# Patient Record
Sex: Male | Born: 1950 | Race: Black or African American | Hispanic: No | Marital: Married | Smoking: Never smoker
Health system: Southern US, Community
[De-identification: ages and names within clinical notes are randomized; demographics above are authoritative.]

---

## 2005-01-09 ENCOUNTER — Ambulatory Visit: Payer: Self-pay | Admitting: Unknown Physician Specialty

## 2007-09-24 ENCOUNTER — Other Ambulatory Visit: Payer: Self-pay

## 2007-09-24 ENCOUNTER — Emergency Department: Payer: Self-pay | Admitting: Emergency Medicine

## 2008-04-05 ENCOUNTER — Ambulatory Visit: Payer: Self-pay | Admitting: Internal Medicine

## 2009-04-10 ENCOUNTER — Ambulatory Visit: Payer: Self-pay | Admitting: Internal Medicine

## 2009-04-21 IMAGING — US ABDOMEN ULTRASOUND
1 series · 17 of 25 positions shown · non-contrast
Comparison: none

REASON FOR EXAM: cirrhosis of liver
COMMENTS:

[Series 1: abdomen ultrasound · 17 of 64 slices shown]
[im 1/64]
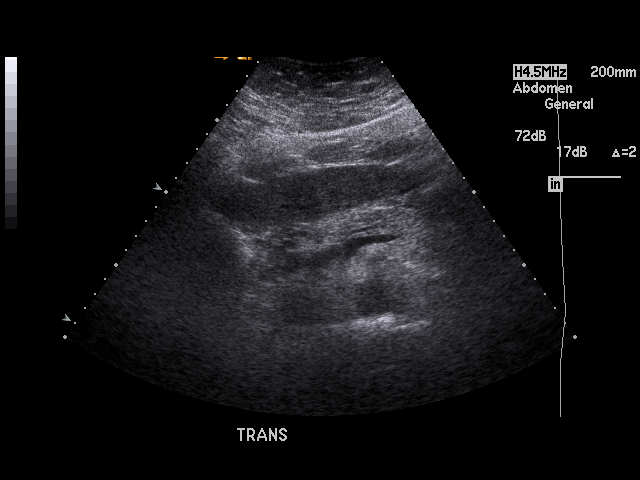
[im 6/64]
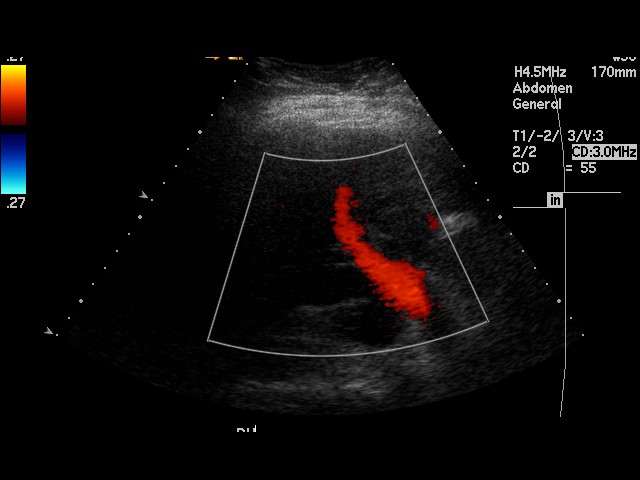
[im 8/64]
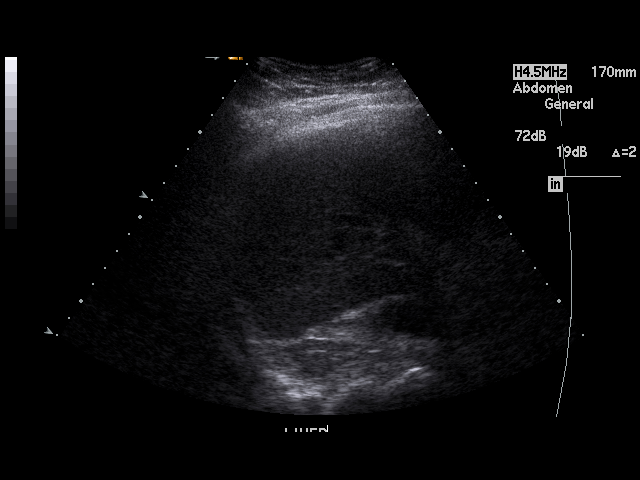
[im 14/64]
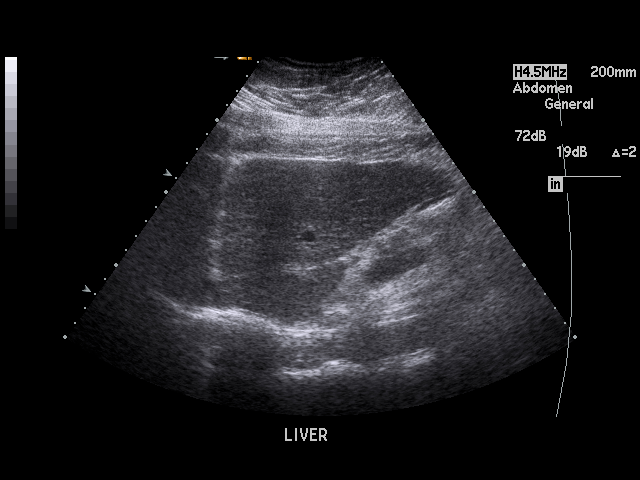
[im 16/64]
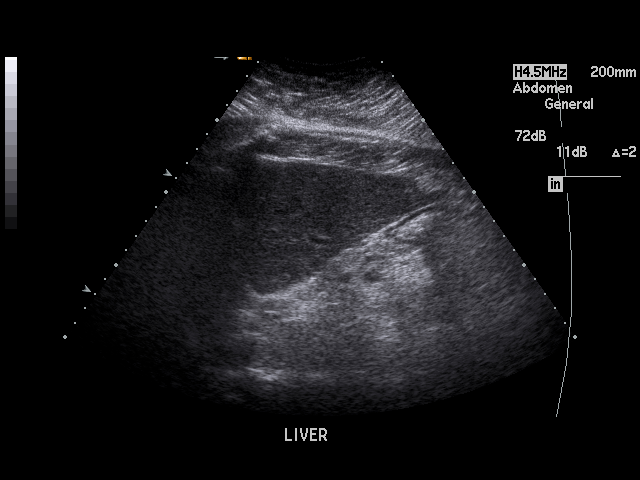
[im 22/64]
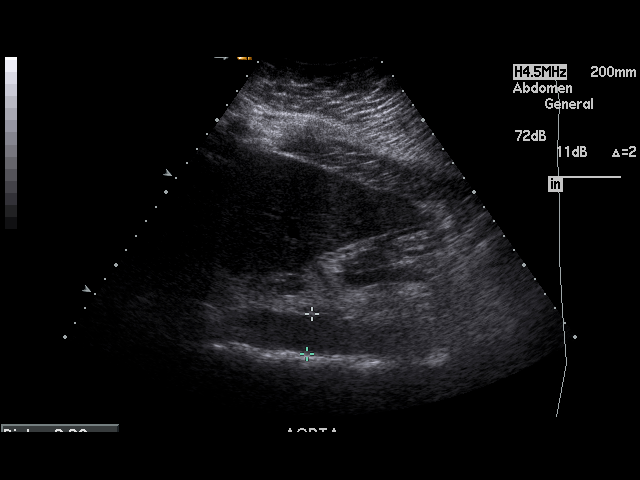
[im 24/64]
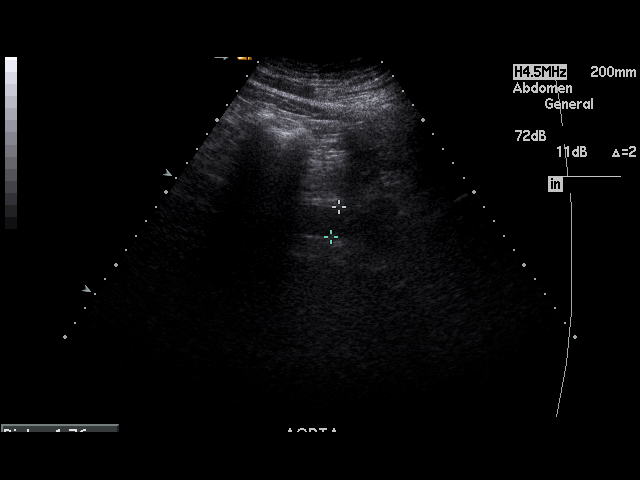
[im 29/64]
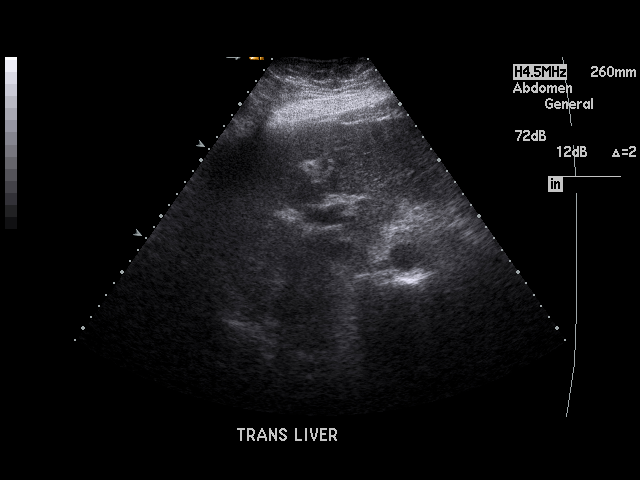
[im 32/64]
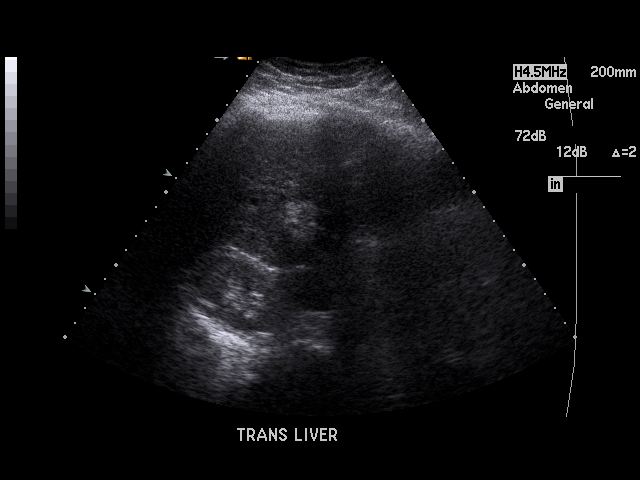
[im 35/64]
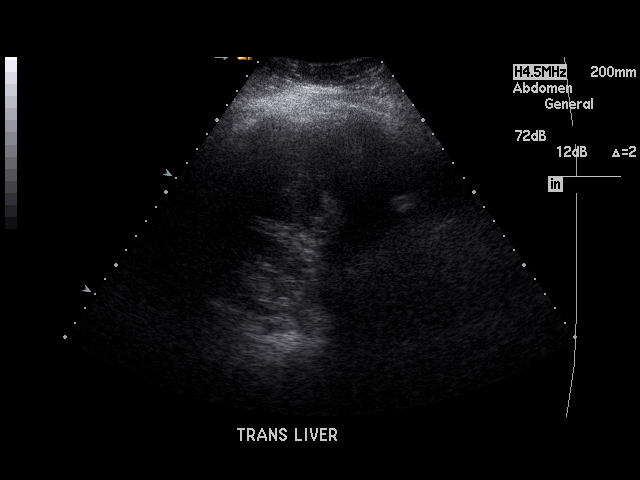
[im 40/64]
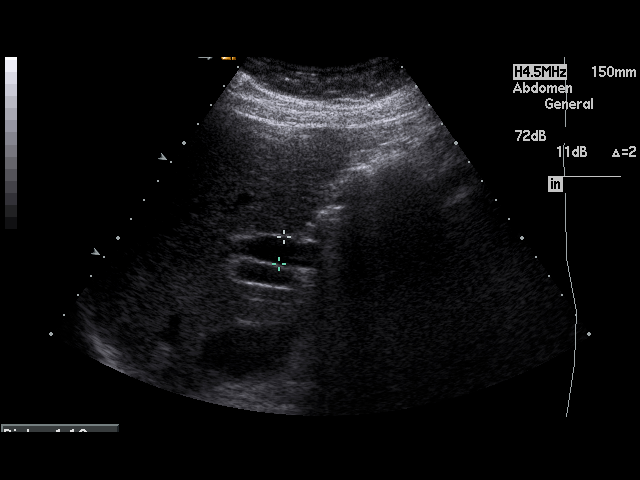
[im 43/64]
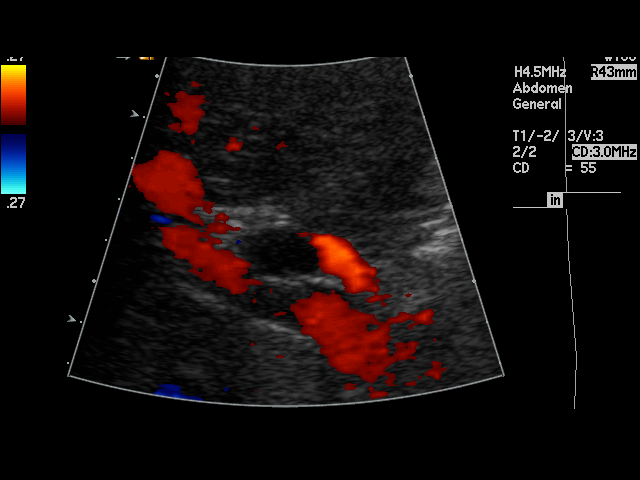
[im 48/64]
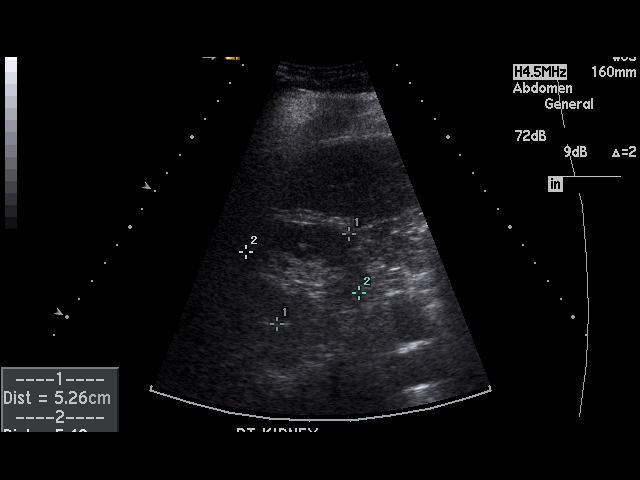
[im 50/64]
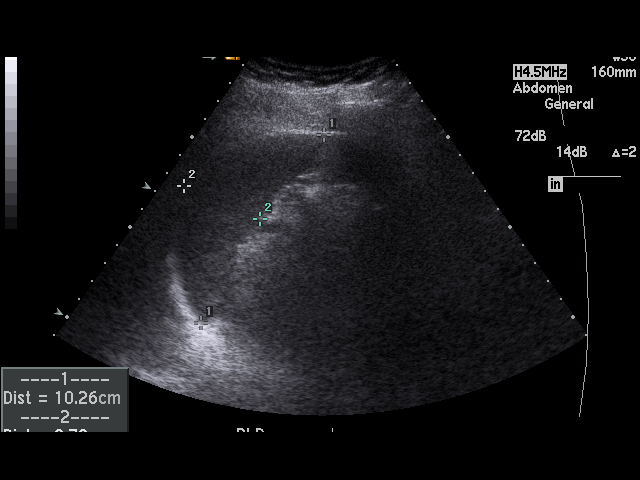
[im 56/64]
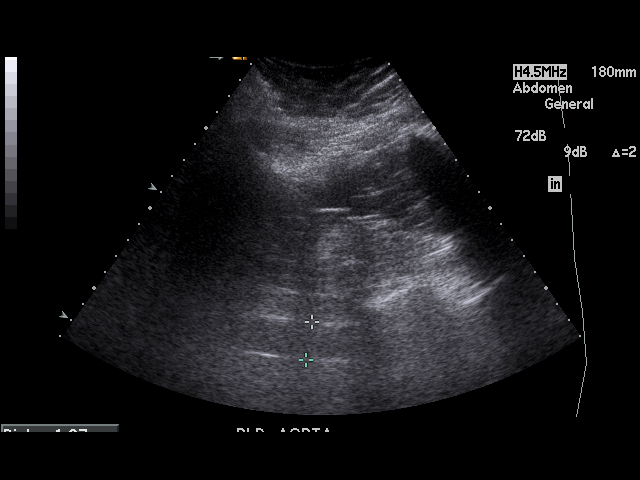
[im 58/64]
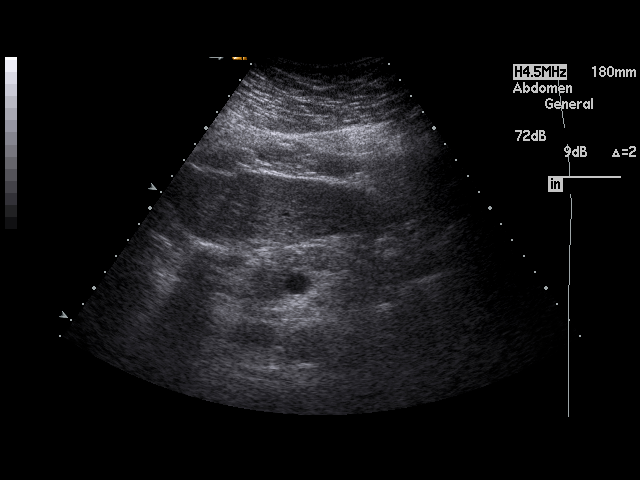
[im 64/64]
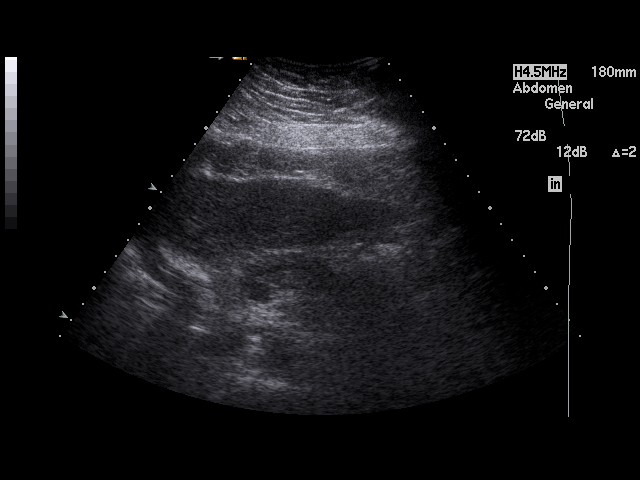

[17 of 25 positions shown; findings below may reference images not displayed]

PROCEDURE:     US  - US ABDOMEN GENERAL SURVEY  - April 05, 2008  [DATE]

RESULT:     The liver exhibits normal echotexture with no focal mass or
ductal dilation identified. The surface of the liver does not appear
abnormal in terms of being irregular or nodular in appearance. Portal venous
flow is normal in direction toward the liver. The pancreas is normal in
echotexture with no evidence of a mass or ductal dilation. The common bile
duct is mildly dilated at 1.2 cm. No intraluminal echoes are seen in the
common bile duct.

The spleen is normal in size at 10.6 cm. The abdominal aorta measures 2.2 cm
in diameter. The LEFT kidney measures 11.5 cm in length and the RIGHT kidney
12.6 cm in length. There is no evidence of ascites.
IMPRESSION: 1. The gallbladder is surgically absent.
2. The common bile duct is mildly dilated at 1.2 cm.
3. I do not see evidence of abnormality of the liver or spleen or pancreas.

## 2010-04-26 IMAGING — US ABDOMEN ULTRASOUND
1 series · 17 of 25 positions shown · non-contrast
Comparison: none

REASON FOR EXAM: cirrhosis
COMMENTS:

[Series 1: abdomen ultrasound · 17 of 62 slices shown]
[im 1/62]
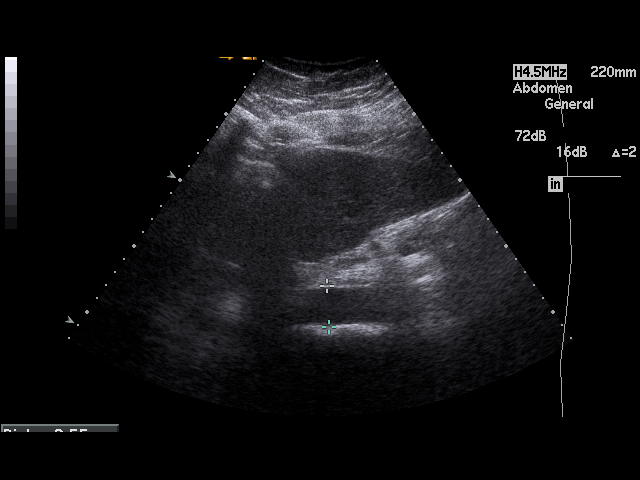
[im 6/62]
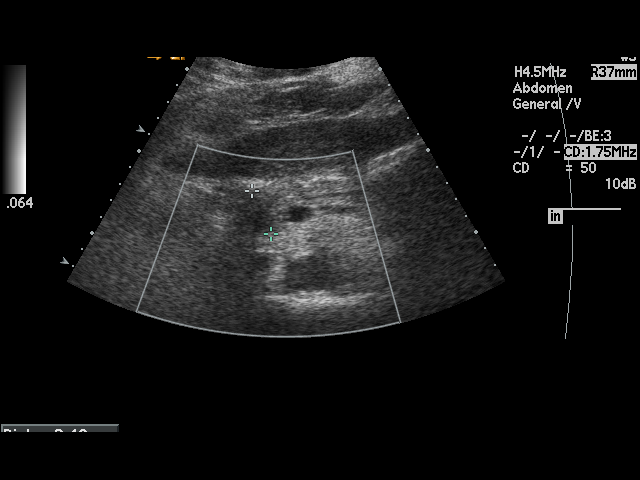
[im 8/62]
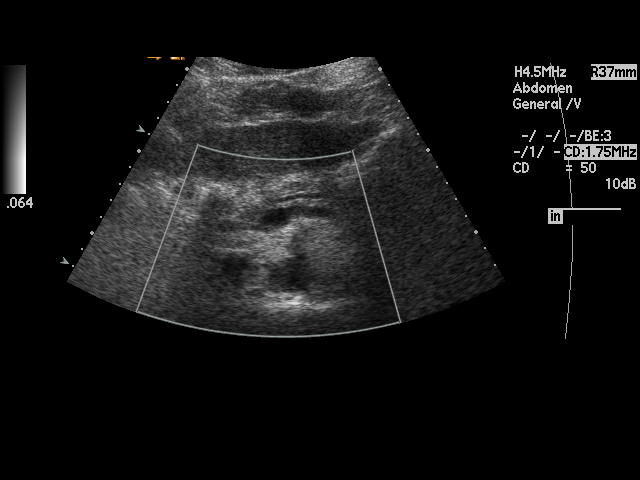
[im 13/62]
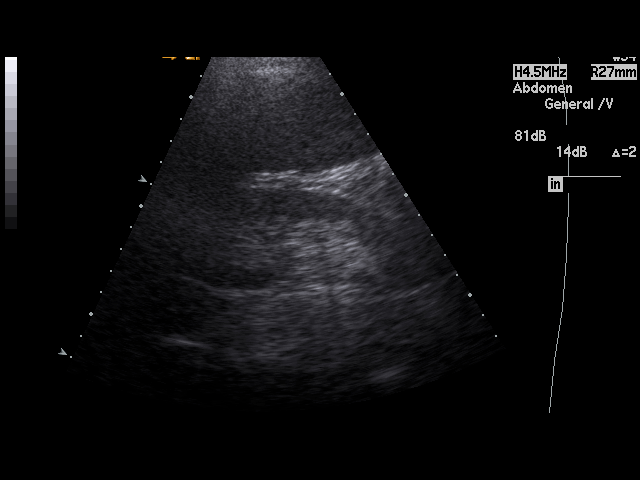
[im 16/62]
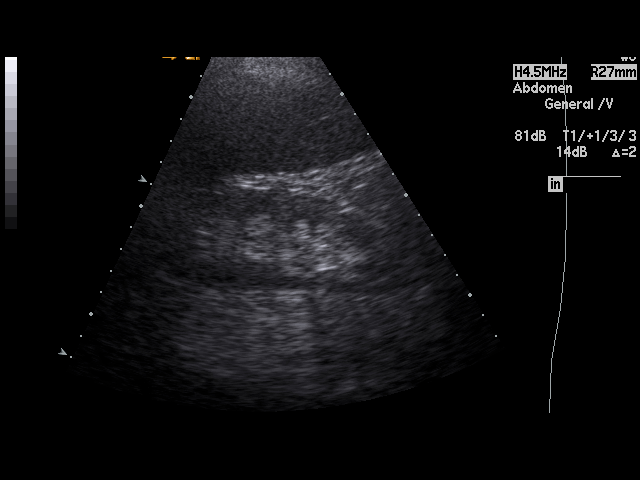
[im 21/62]
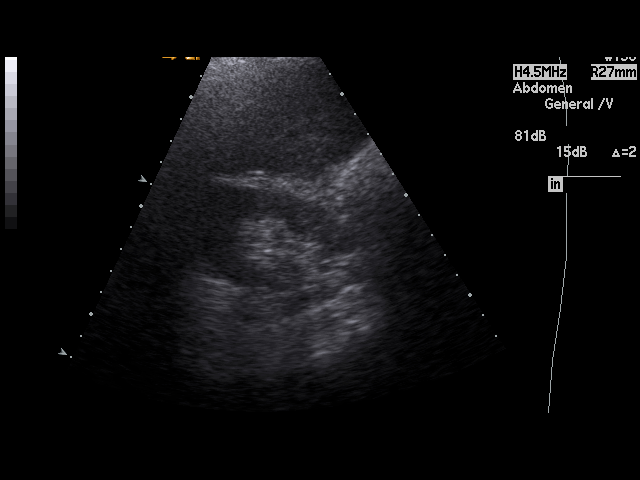
[im 23/62]
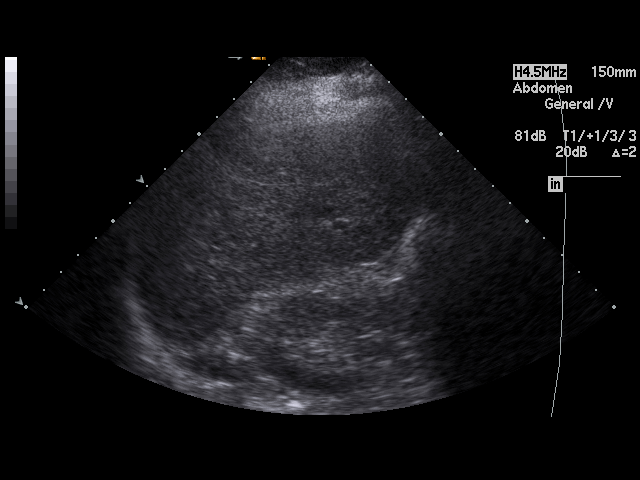
[im 28/62]
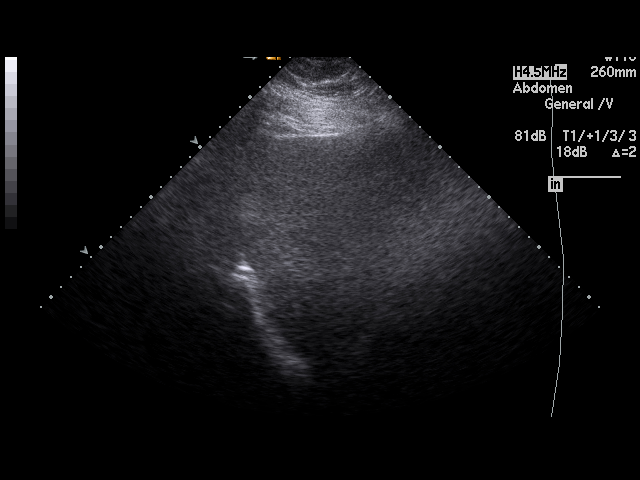
[im 31/62]
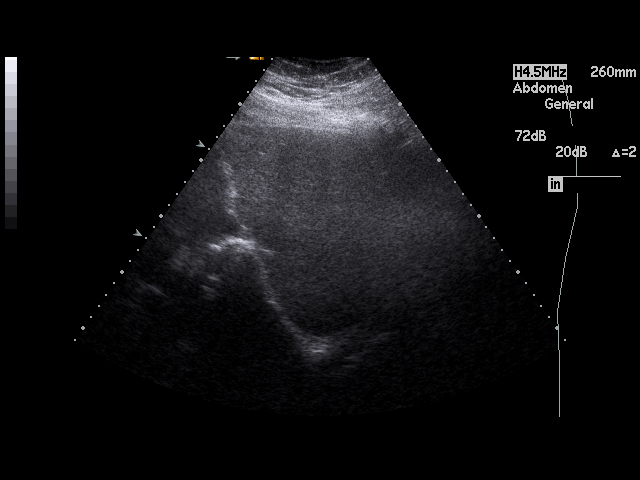
[im 34/62]
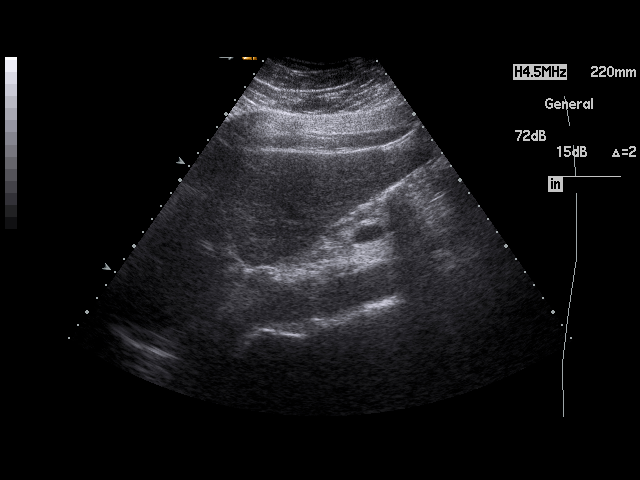
[im 39/62]
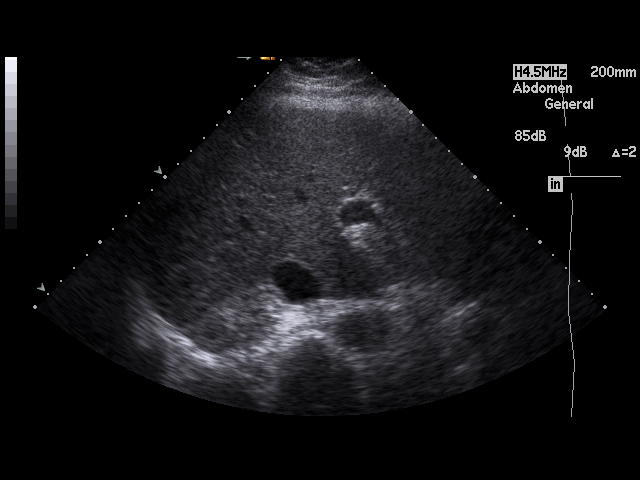
[im 41/62]
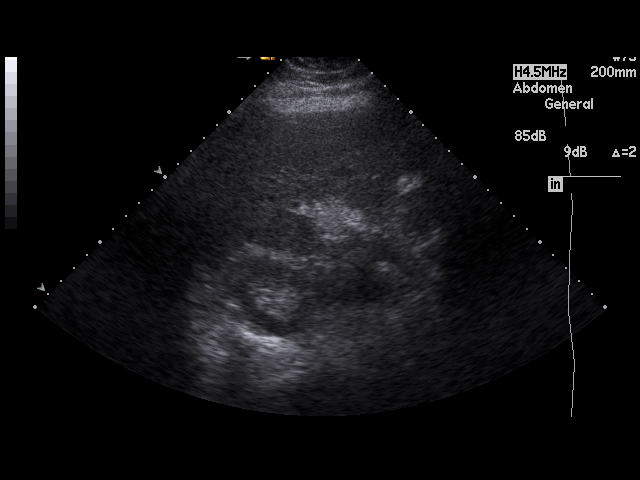
[im 46/62]
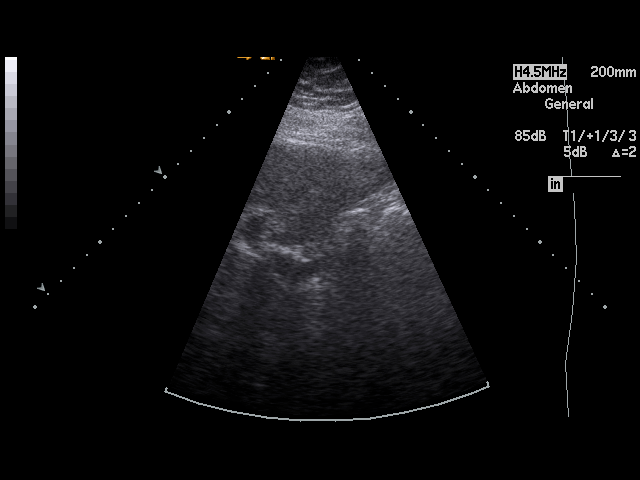
[im 49/62]
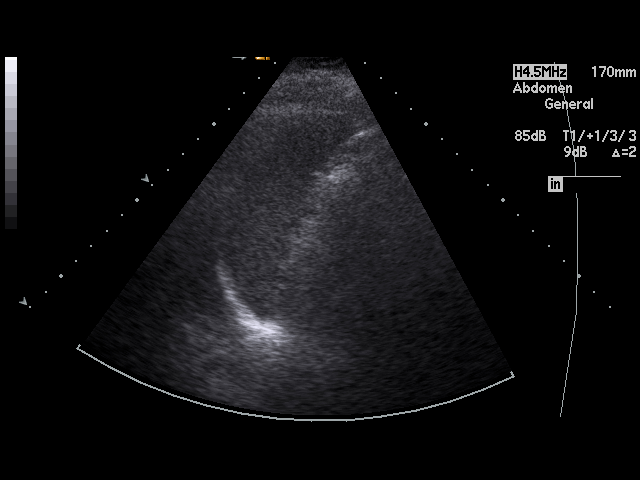
[im 54/62]
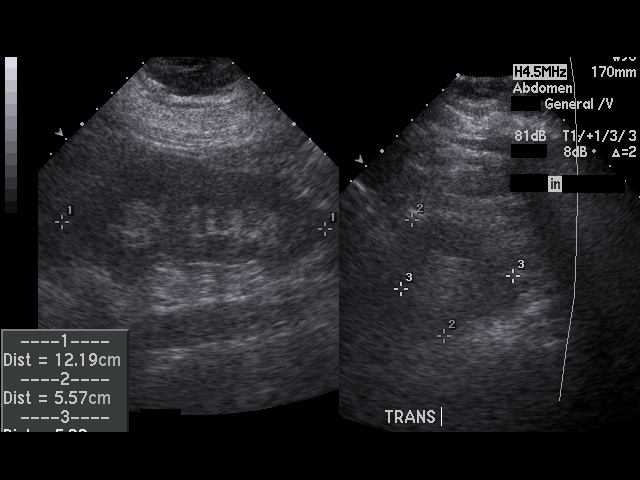
[im 56/62]
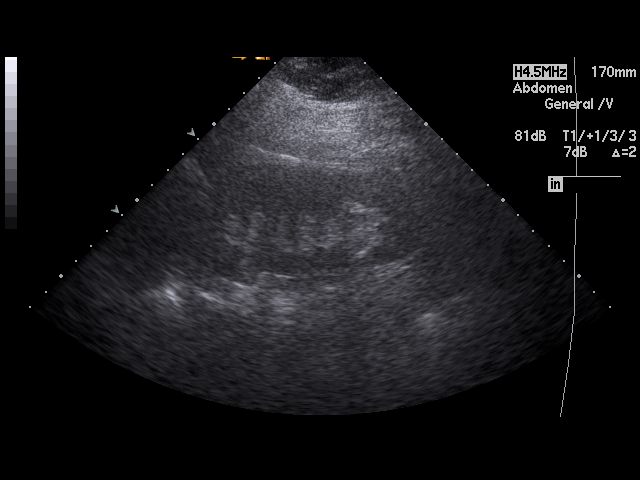
[im 62/62]
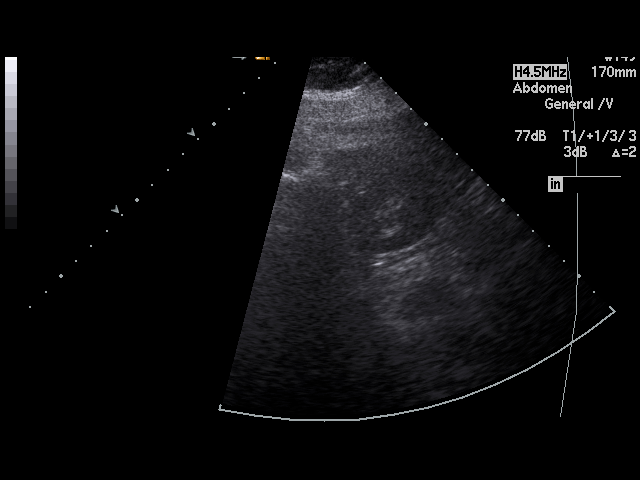

[17 of 25 positions shown; findings below may reference images not displayed]

PROCEDURE:     US  - US ABDOMEN GENERAL SURVEY  - April 10, 2009  [DATE]

RESULT:     The hepatic echo pattern appears normal. No hepatic mass lesions
are seen. The spleen is normal in size. The pancreas is normal in size.
There is nonspecific prominence of the pancreatic duct which measures 2.9 mm
in diameter. No pancreatic mass is seen. No pancreatic calcifications are
noted sonographically. The abdominal aorta and inferior vena cava show no
significant abnormalities. The patient is status post cholecystectomy. The
common bile duct measures 9.2 mm in diameter which is prominent but which
can be within normal limits for the post cholecystectomy patient and which
is smaller than was noted on the prior exam of 04/05/2008. The kidneys show
no hydronephrosis. There is no ascites.
IMPRESSION: 1.  No significant abnormalities are identified.
2.  There is noted nonspecific prominence of the pancreatic duct.
3.  The patient is status post cholecystectomy.
4.  Portal vein flow is hepatopedal.

## 2010-08-13 ENCOUNTER — Ambulatory Visit: Payer: Self-pay | Admitting: Unknown Physician Specialty

## 2010-08-31 ENCOUNTER — Ambulatory Visit: Payer: Self-pay | Admitting: Unknown Physician Specialty

## 2011-12-23 ENCOUNTER — Emergency Department: Payer: Self-pay | Admitting: Emergency Medicine

## 2011-12-23 LAB — URINALYSIS, COMPLETE
Bilirubin,UR: NEGATIVE
Glucose,UR: NEGATIVE mg/dL (ref 0–75)
Leukocyte Esterase: NEGATIVE
Nitrite: NEGATIVE
Ph: 5 (ref 4.5–8.0)
Protein: 30
Specific Gravity: 1.012 (ref 1.003–1.030)
Squamous Epithelial: 1
WBC UR: 4 /HPF (ref 0–5)

## 2011-12-23 LAB — COMPREHENSIVE METABOLIC PANEL
Albumin: 3.3 g/dL — ABNORMAL LOW (ref 3.4–5.0)
Alkaline Phosphatase: 63 U/L (ref 50–136)
Anion Gap: 11 (ref 7–16)
BUN: 13 mg/dL (ref 7–18)
Bilirubin,Total: 0.5 mg/dL (ref 0.2–1.0)
Chloride: 103 mmol/L (ref 98–107)
Co2: 23 mmol/L (ref 21–32)
Creatinine: 1.21 mg/dL (ref 0.60–1.30)
Osmolality: 274 (ref 275–301)
Potassium: 3.8 mmol/L (ref 3.5–5.1)
SGOT(AST): 46 U/L — ABNORMAL HIGH (ref 15–37)
SGPT (ALT): 30 U/L
Sodium: 137 mmol/L (ref 136–145)

## 2011-12-23 LAB — CBC
HCT: 40.5 % (ref 40.0–52.0)
HGB: 13.4 g/dL (ref 13.0–18.0)
MCH: 25.7 pg — ABNORMAL LOW (ref 26.0–34.0)
MCHC: 33 g/dL (ref 32.0–36.0)
Platelet: 72 10*3/uL — ABNORMAL LOW (ref 150–440)
RDW: 16.5 % — ABNORMAL HIGH (ref 11.5–14.5)

## 2011-12-23 LAB — PRO B NATRIURETIC PEPTIDE: B-Type Natriuretic Peptide: 37 pg/mL (ref 0–125)

## 2013-01-07 IMAGING — CR DG CHEST 2V
1 series · 3 of 3 positions shown · non-contrast
Comparison: none

REASON FOR EXAM: fever, weakness
COMMENTS:

PROCEDURE:     DXR - DXR CHEST PA (OR AP) AND LATERAL  - December 23, 2011 [DATE]
RESULT:     The lung fields are clear. The heart, mediastinal and osseous
structures show no acute changes.

[Series 1: pa · 0.17mm/px · 3 of 3 slices shown]
[im 1/3]
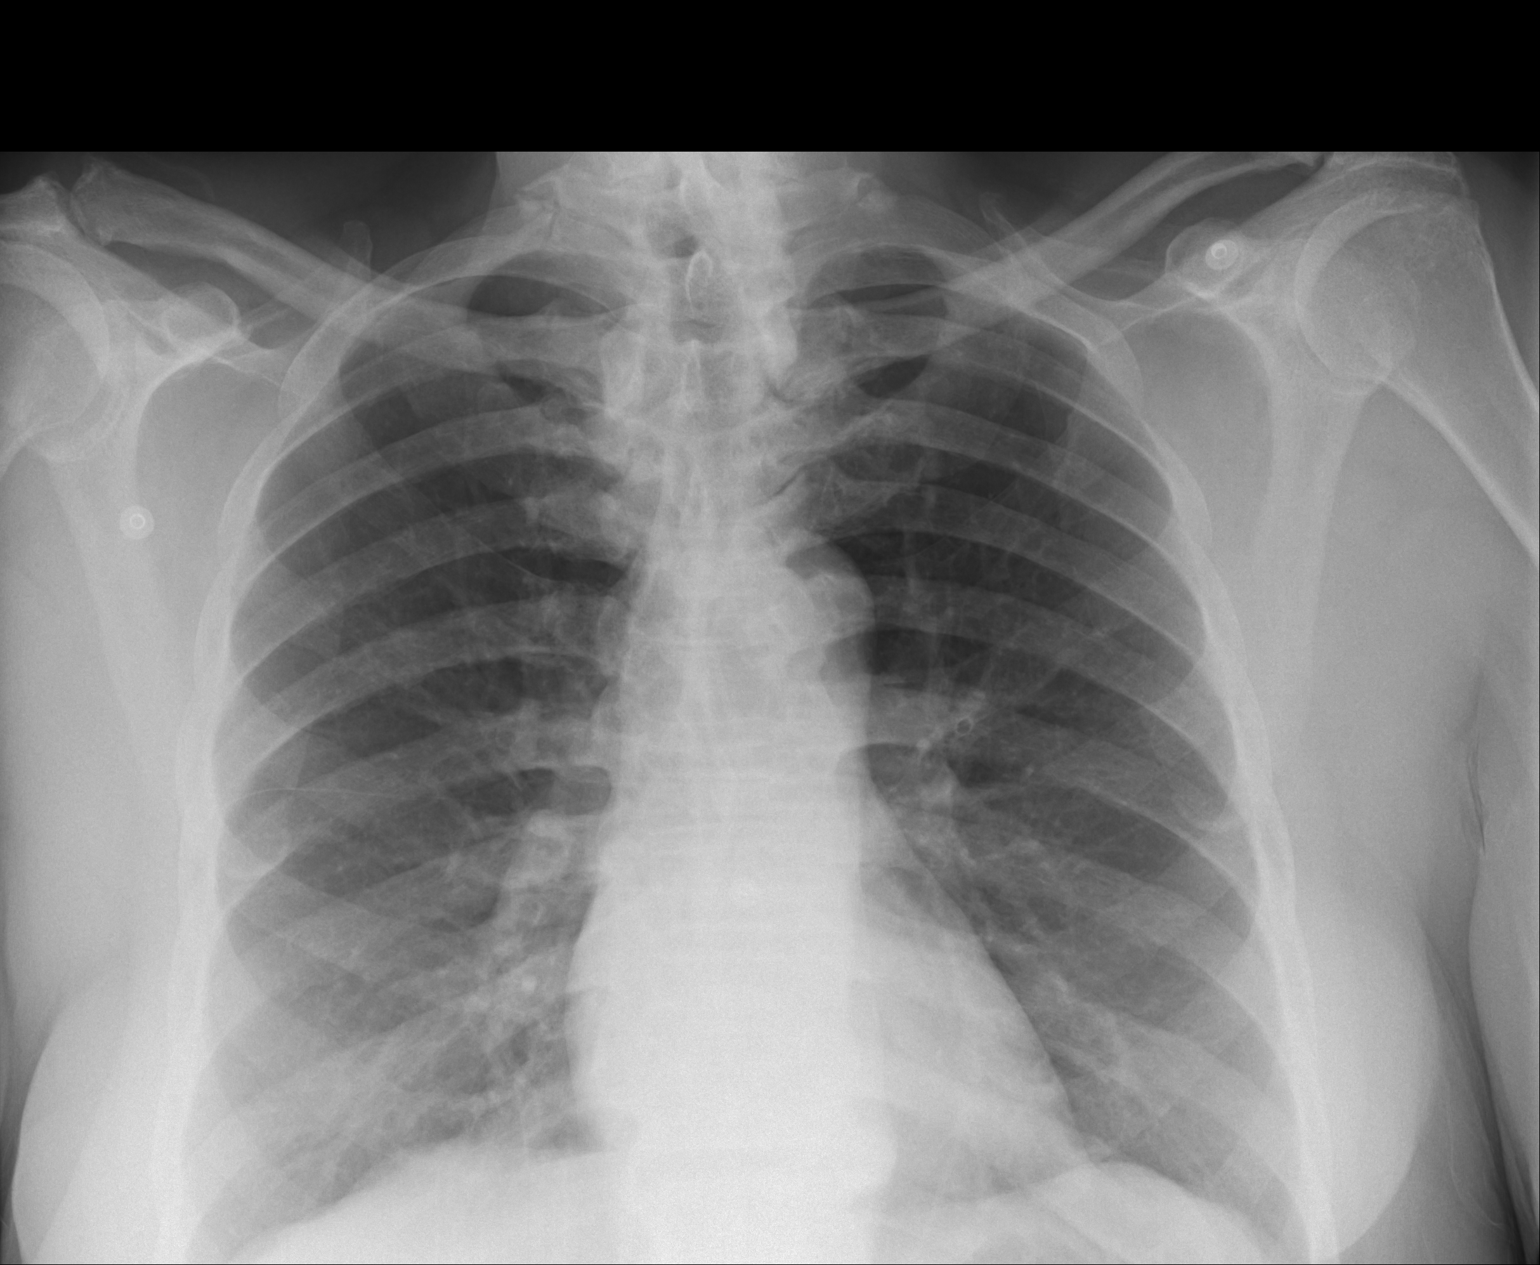
[im 2/3]
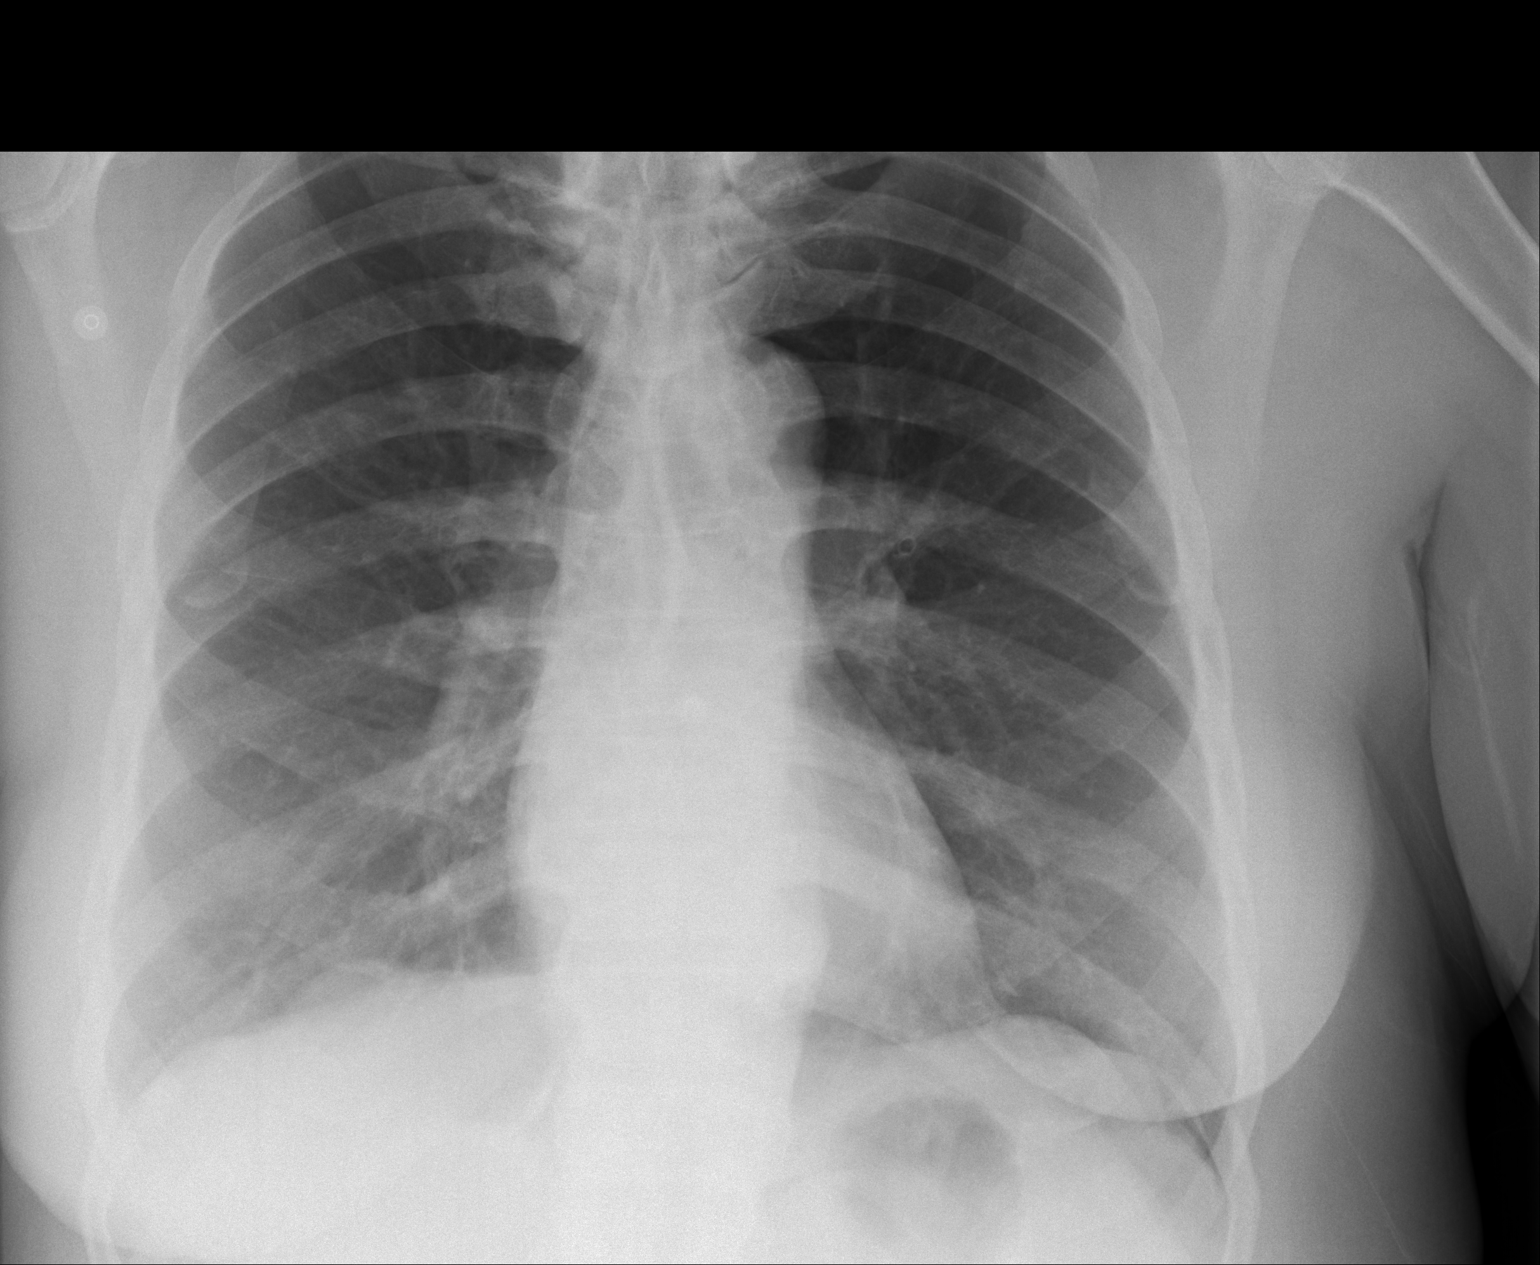
[im 3/3]
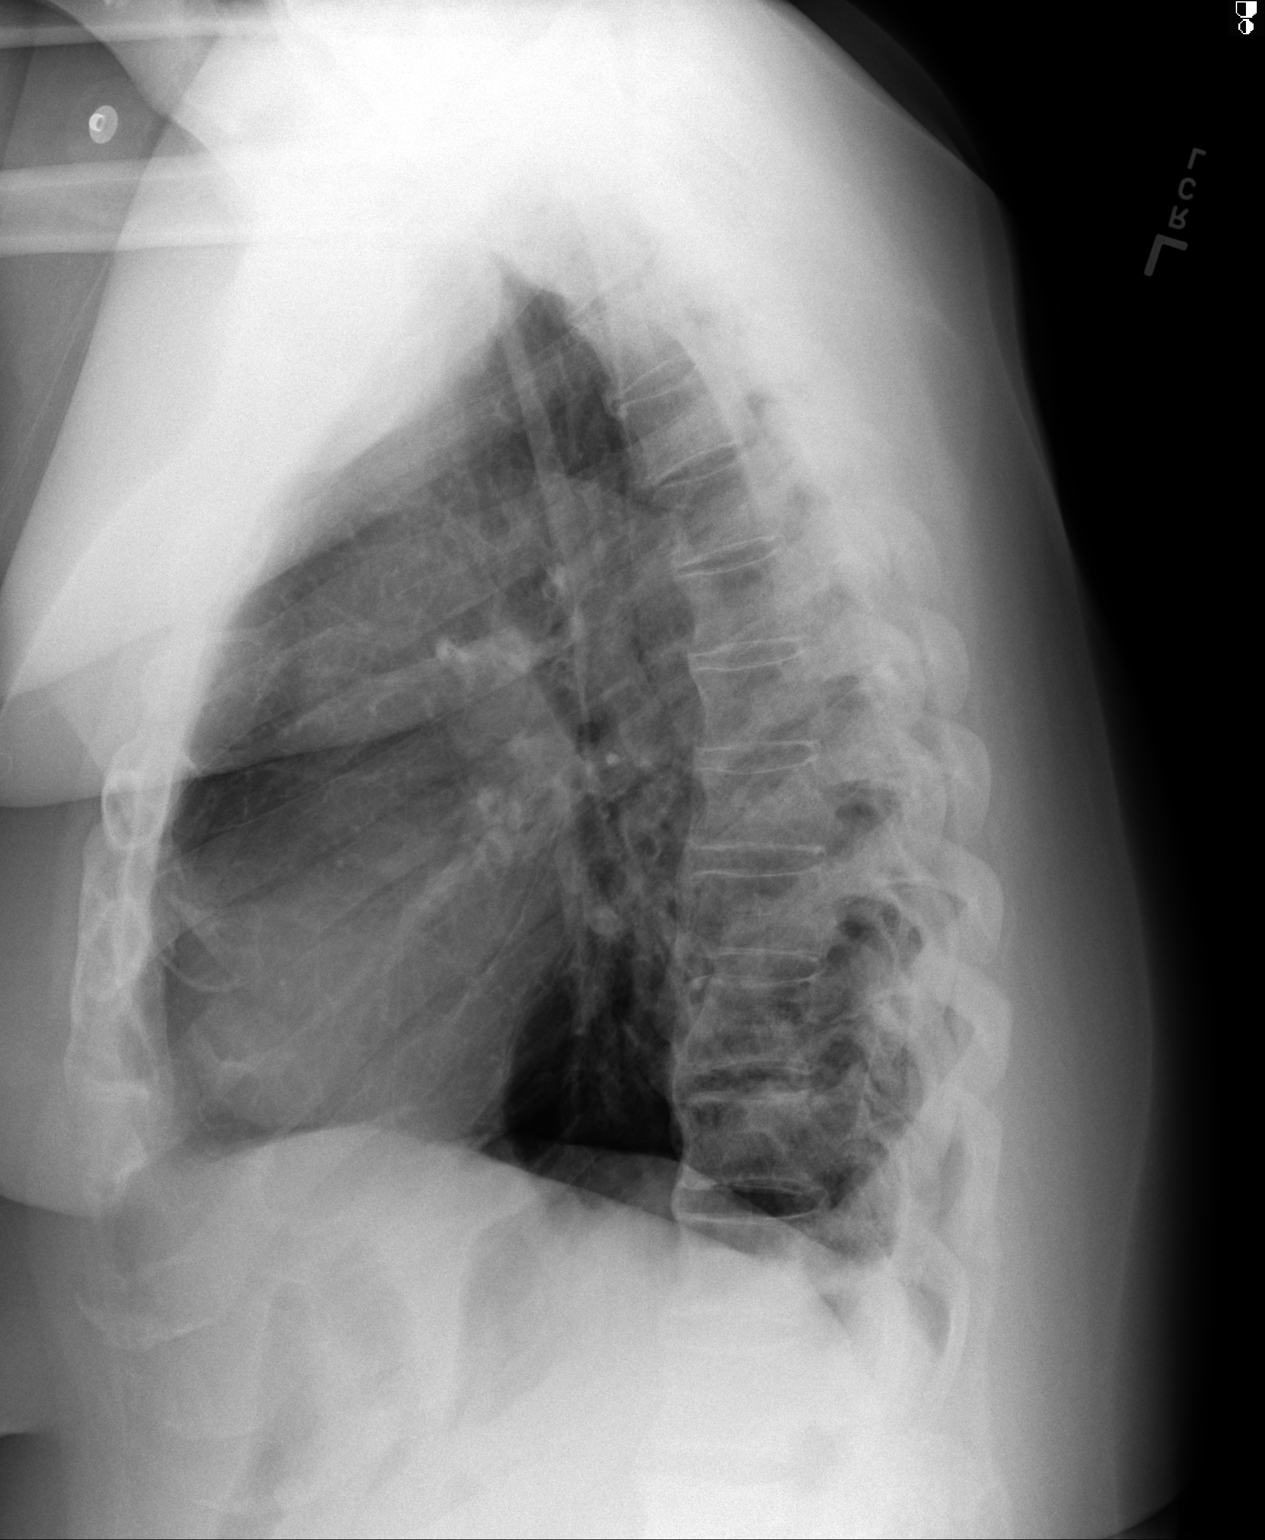

[3 of 3 positions shown; findings below may reference images not displayed]

IMPRESSION: 1.     No acute changes are identified.

## 2015-03-08 DIAGNOSIS — Z125 Encounter for screening for malignant neoplasm of prostate: Secondary | ICD-10-CM | POA: Diagnosis not present

## 2015-03-08 DIAGNOSIS — R7309 Other abnormal glucose: Secondary | ICD-10-CM | POA: Diagnosis not present

## 2015-03-08 DIAGNOSIS — D696 Thrombocytopenia, unspecified: Secondary | ICD-10-CM | POA: Diagnosis not present

## 2015-03-08 DIAGNOSIS — J302 Other seasonal allergic rhinitis: Secondary | ICD-10-CM | POA: Diagnosis not present

## 2015-03-08 DIAGNOSIS — M1000 Idiopathic gout, unspecified site: Secondary | ICD-10-CM | POA: Diagnosis not present

## 2015-03-08 DIAGNOSIS — K7030 Alcoholic cirrhosis of liver without ascites: Secondary | ICD-10-CM | POA: Diagnosis not present

## 2015-03-15 DIAGNOSIS — K7030 Alcoholic cirrhosis of liver without ascites: Secondary | ICD-10-CM | POA: Diagnosis not present

## 2015-03-15 DIAGNOSIS — E538 Deficiency of other specified B group vitamins: Secondary | ICD-10-CM | POA: Diagnosis not present

## 2015-03-15 DIAGNOSIS — D696 Thrombocytopenia, unspecified: Secondary | ICD-10-CM | POA: Diagnosis not present

## 2015-09-27 DIAGNOSIS — D696 Thrombocytopenia, unspecified: Secondary | ICD-10-CM | POA: Diagnosis not present

## 2015-09-27 DIAGNOSIS — K7030 Alcoholic cirrhosis of liver without ascites: Secondary | ICD-10-CM | POA: Diagnosis not present

## 2015-09-27 DIAGNOSIS — R252 Cramp and spasm: Secondary | ICD-10-CM | POA: Diagnosis not present

## 2015-09-27 DIAGNOSIS — E538 Deficiency of other specified B group vitamins: Secondary | ICD-10-CM | POA: Diagnosis not present

## 2015-10-03 DIAGNOSIS — R252 Cramp and spasm: Secondary | ICD-10-CM | POA: Diagnosis not present

## 2015-10-03 DIAGNOSIS — D696 Thrombocytopenia, unspecified: Secondary | ICD-10-CM | POA: Diagnosis not present

## 2015-10-03 DIAGNOSIS — A5903 Trichomonal cystitis and urethritis: Secondary | ICD-10-CM | POA: Diagnosis not present

## 2015-10-03 DIAGNOSIS — K7030 Alcoholic cirrhosis of liver without ascites: Secondary | ICD-10-CM | POA: Diagnosis not present

## 2015-10-03 DIAGNOSIS — Z0000 Encounter for general adult medical examination without abnormal findings: Secondary | ICD-10-CM | POA: Diagnosis not present

## 2016-03-26 DIAGNOSIS — D696 Thrombocytopenia, unspecified: Secondary | ICD-10-CM | POA: Diagnosis not present

## 2016-03-26 DIAGNOSIS — A5903 Trichomonal cystitis and urethritis: Secondary | ICD-10-CM | POA: Diagnosis not present

## 2016-03-26 DIAGNOSIS — R252 Cramp and spasm: Secondary | ICD-10-CM | POA: Diagnosis not present

## 2016-03-26 DIAGNOSIS — K7030 Alcoholic cirrhosis of liver without ascites: Secondary | ICD-10-CM | POA: Diagnosis not present

## 2016-04-02 DIAGNOSIS — K7030 Alcoholic cirrhosis of liver without ascites: Secondary | ICD-10-CM | POA: Diagnosis not present

## 2016-04-02 DIAGNOSIS — D696 Thrombocytopenia, unspecified: Secondary | ICD-10-CM | POA: Diagnosis not present

## 2016-04-02 DIAGNOSIS — D6489 Other specified anemias: Secondary | ICD-10-CM | POA: Diagnosis not present

## 2016-04-02 DIAGNOSIS — Z6841 Body Mass Index (BMI) 40.0 and over, adult: Secondary | ICD-10-CM | POA: Diagnosis not present

## 2016-04-02 DIAGNOSIS — E538 Deficiency of other specified B group vitamins: Secondary | ICD-10-CM | POA: Diagnosis not present

## 2016-05-27 DIAGNOSIS — K7030 Alcoholic cirrhosis of liver without ascites: Secondary | ICD-10-CM | POA: Diagnosis not present

## 2016-05-27 DIAGNOSIS — Z6841 Body Mass Index (BMI) 40.0 and over, adult: Secondary | ICD-10-CM | POA: Diagnosis not present

## 2016-05-27 DIAGNOSIS — D6489 Other specified anemias: Secondary | ICD-10-CM | POA: Diagnosis not present

## 2016-05-27 DIAGNOSIS — D696 Thrombocytopenia, unspecified: Secondary | ICD-10-CM | POA: Diagnosis not present

## 2016-05-27 DIAGNOSIS — E538 Deficiency of other specified B group vitamins: Secondary | ICD-10-CM | POA: Diagnosis not present

## 2016-09-26 DIAGNOSIS — D696 Thrombocytopenia, unspecified: Secondary | ICD-10-CM | POA: Diagnosis not present

## 2016-09-26 DIAGNOSIS — Z6841 Body Mass Index (BMI) 40.0 and over, adult: Secondary | ICD-10-CM | POA: Diagnosis not present

## 2016-09-26 DIAGNOSIS — E538 Deficiency of other specified B group vitamins: Secondary | ICD-10-CM | POA: Diagnosis not present

## 2016-09-26 DIAGNOSIS — Z125 Encounter for screening for malignant neoplasm of prostate: Secondary | ICD-10-CM | POA: Diagnosis not present

## 2016-09-26 DIAGNOSIS — K7030 Alcoholic cirrhosis of liver without ascites: Secondary | ICD-10-CM | POA: Diagnosis not present

## 2016-10-21 DIAGNOSIS — M109 Gout, unspecified: Secondary | ICD-10-CM | POA: Diagnosis not present

## 2016-10-21 DIAGNOSIS — K7030 Alcoholic cirrhosis of liver without ascites: Secondary | ICD-10-CM | POA: Diagnosis not present

## 2016-10-21 DIAGNOSIS — Z0000 Encounter for general adult medical examination without abnormal findings: Secondary | ICD-10-CM | POA: Diagnosis not present

## 2016-10-21 DIAGNOSIS — Z6841 Body Mass Index (BMI) 40.0 and over, adult: Secondary | ICD-10-CM | POA: Diagnosis not present

## 2016-10-21 DIAGNOSIS — D696 Thrombocytopenia, unspecified: Secondary | ICD-10-CM | POA: Diagnosis not present

## 2017-04-16 DIAGNOSIS — Z8639 Personal history of other endocrine, nutritional and metabolic disease: Secondary | ICD-10-CM | POA: Diagnosis not present

## 2017-04-21 DIAGNOSIS — Z6841 Body Mass Index (BMI) 40.0 and over, adult: Secondary | ICD-10-CM | POA: Diagnosis not present

## 2017-04-21 DIAGNOSIS — D696 Thrombocytopenia, unspecified: Secondary | ICD-10-CM | POA: Diagnosis not present

## 2017-04-21 DIAGNOSIS — E669 Obesity, unspecified: Secondary | ICD-10-CM | POA: Diagnosis not present

## 2017-04-21 DIAGNOSIS — K7030 Alcoholic cirrhosis of liver without ascites: Secondary | ICD-10-CM | POA: Diagnosis not present

## 2017-04-21 DIAGNOSIS — M109 Gout, unspecified: Secondary | ICD-10-CM | POA: Diagnosis not present

## 2017-04-21 DIAGNOSIS — Z125 Encounter for screening for malignant neoplasm of prostate: Secondary | ICD-10-CM | POA: Diagnosis not present

## 2019-04-09 ENCOUNTER — Encounter: Payer: Self-pay | Admitting: Emergency Medicine

## 2019-04-09 ENCOUNTER — Other Ambulatory Visit: Payer: Self-pay

## 2019-04-09 ENCOUNTER — Emergency Department
Admission: EM | Admit: 2019-04-09 | Discharge: 2019-04-09 | Disposition: A | Discharge status: Home / Self Care | Payer: Medicare HMO | Attending: Emergency Medicine | Admitting: Emergency Medicine

## 2019-04-09 DIAGNOSIS — M10031 Idiopathic gout, right wrist: Secondary | ICD-10-CM | POA: Diagnosis not present

## 2019-04-09 DIAGNOSIS — Z79899 Other long term (current) drug therapy: Secondary | ICD-10-CM | POA: Diagnosis not present

## 2019-04-09 DIAGNOSIS — M25531 Pain in right wrist: Secondary | ICD-10-CM | POA: Diagnosis present

## 2019-04-09 MED ORDER — INDOMETHACIN 25 MG PO CAPS: 25.0000 mg | ORAL_CAPSULE | Freq: Two times a day (BID) | ORAL | 0 refills | Status: AC

## 2019-04-09 MED ORDER — TRAMADOL HCL 50 MG PO TABS: 50.0000 mg | ORAL_TABLET | Freq: Two times a day (BID) | ORAL | 0 refills | Status: AC | PRN

## 2019-04-09 NOTE — ED Triage Notes (Signed)
Pt to ED via POV c/o right wrist pain. Pt states that he has hx/o gout. Pt is in NAD.

## 2019-04-09 NOTE — ED Provider Notes (Signed)
[facility] Emergency Department Provider Note   ____________________________________________   First MD Initiated Contact with Patient 04/09/19 1048     (approximate)  I have reviewed the triage vital signs and the nursing notes.   HISTORY  Chief Complaint Wrist Pain    HPI Patrick Garza is a 68 y.o. male patient complain of right wrist pain secondary to gout.  Patient states every once in a while his gout flares up although he takes allopurinol.  This episode started approximately a week ago.  Patient rates the pain as 8/10.  Patient described the pain is "achy".  No other palliative measure for complaint.         History reviewed. No pertinent past medical history.  There are no active problems to display for this patient.   History reviewed. No pertinent surgical history.  Prior to Admission medications   Medication Sig Start Date End Date Taking? Authorizing Provider  allopurinol (ZYLOPRIM) 100 MG tablet Take 100 mg by mouth daily.   Yes [provider]  amLODipine (NORVASC) 5 MG tablet Take 5 mg by mouth daily.   Yes [provider]  indomethacin (INDOCIN) 25 MG capsule Take 1 capsule (25 mg total) by mouth 2 (two) times daily with a meal for 5 days. 04/09/19 04/14/19  Sable Feil, PA-C  traMADol (ULTRAM) 50 MG tablet Take 1 tablet (50 mg total) by mouth every 12 (twelve) hours as needed. 04/09/19   Sable Feil, PA-C    Allergies Patient has no known allergies.  No family history on file.  Social History Social History   Tobacco Use  . Smoking status: Never Smoker  . Smokeless tobacco: Never Used  Substance Use Topics  . Alcohol use: Not Currently  . Drug use: Not Currently    Review of Systems Constitutional: No fever/chills Eyes: No visual changes. ENT: No sore throat. Cardiovascular: Denies chest pain. Respiratory: Denies shortness of breath. Gastrointestinal: No abdominal pain.  No nausea, no  vomiting.  No diarrhea.  No constipation. Genitourinary: Negative for dysuria. Musculoskeletal: Right wrist pain.   Skin: Negative for rash.  Swelling right wrist. Neurological: Negative for headaches, focal weakness or numbness. Endocrine:  Gout and hypertension.   ____________________________________________   PHYSICAL EXAM:  VITAL SIGNS: ED Triage Vitals  Enc Vitals Group     BP 04/09/19 1025 (!) 177/75     Pulse Rate 04/09/19 1025 89     Resp 04/09/19 1025 18     Temp 04/09/19 1025 98.9 F (37.2 C)     Temp Source 04/09/19 1025 Oral     SpO2 04/09/19 1025 94 %     Weight --      Height --      Head Circumference --      Peak Flow --      Pain Score 04/09/19 1017 8     Pain Loc --      Pain Edu? --      Excl. in [place]? --    Constitutional: Alert and oriented. Well appearing and in no acute distress. Hematological/Lymphatic/Immunilogical: No cervical lymphadenopathy. Cardiovascular: Normal rate, regular rhythm. Grossly normal heart sounds.  Good peripheral circulation.  Evaded blood pressure. Respiratory: Normal respiratory effort.  No retractions. Lungs CTAB. Gastrointestinal: Soft and nontender. No distention. No abdominal bruits. No CVA tenderness. Musculoskeletal: No obvious deformity to the right wrist.  Mild edema and and guarding palpation of distal radius.  Neurologic:  Normal speech and language. No gross  focal neurologic deficits are appreciated. No gait instability. Skin:  Skin is warm, dry and intact. No rash noted. Psychiatric: Mood and affect are normal. Speech and behavior are normal.  ____________________________________________   LABS (all labs ordered are listed, but only abnormal results are displayed)  Labs Reviewed - No data to display ____________________________________________  EKG   ____________________________________________  RADIOLOGY  ED MD interpretation:    Official radiology report(s): No results found.   ____________________________________________   PROCEDURES  Procedure(s) performed (including Critical Care):  Procedures   ____________________________________________   INITIAL IMPRESSION / ASSESSMENT AND PLAN / ED COURSE  As part of my medical decision making, I reviewed the following data within the electronic MEDICAL RECORD NUMBER         Right wrist pain secondary to gout.  Patient denies any other provocative incident for complaint.  Patient given discharge care instructions and advised take medication as directed.  Patient advised to follow-up with PCP if in 3 to 5 days.  Return to ED if condition worsens.      ____________________________________________   FINAL CLINICAL IMPRESSION(S) / ED DIAGNOSES  Final diagnoses:  Acute idiopathic gout of right wrist     ED Discharge Orders         Ordered    indomethacin (INDOCIN) 25 MG capsule  2 times daily with meals     04/09/19 1055    traMADol (ULTRAM) 50 MG tablet  Every 12 hours PRN     04/09/19 1055           Note:  This document was prepared using Dragon voice recognition software and may include unintentional dictation errors.    Joni ReiningSmith,  K, PA-C 04/09/19 1059    Concha SeFunke, Mary E, MD 04/09/19 1344

## 2019-04-09 NOTE — ED Notes (Signed)
See triage note  Presents with pain and swelling to right wrist  States he developed pain last Sunday w/o injury  Hx of gout

## 2019-04-09 NOTE — Discharge Instructions (Addendum)
Continue previous medication and start tramadol and indomethacin as directed.  Follow-up with family doctor.
